# Patient Record
Sex: Female | Born: 1965 | Race: White | Hispanic: No | Marital: Married | Smoking: Never smoker
Health system: Southern US, Community
[De-identification: ages and names within clinical notes are randomized; demographics above are authoritative.]

## PROBLEM LIST (undated history)

## (undated) DIAGNOSIS — E7800 Pure hypercholesterolemia, unspecified: Secondary | ICD-10-CM

## (undated) DIAGNOSIS — I10 Essential (primary) hypertension: Secondary | ICD-10-CM

## (undated) DIAGNOSIS — D509 Iron deficiency anemia, unspecified: Secondary | ICD-10-CM

## (undated) HISTORY — PX: TUBAL LIGATION: SHX77

---

## 2016-11-29 ENCOUNTER — Ambulatory Visit
Admission: EM | Admit: 2016-11-29 | Discharge: 2016-11-29 | Disposition: A | Discharge status: Home / Self Care | Payer: BLUE CROSS/BLUE SHIELD | Attending: Emergency Medicine | Admitting: Emergency Medicine

## 2016-11-29 ENCOUNTER — Encounter: Payer: Self-pay | Admitting: *Deleted

## 2016-11-29 DIAGNOSIS — M542 Cervicalgia: Secondary | ICD-10-CM

## 2016-11-29 DIAGNOSIS — R030 Elevated blood-pressure reading, without diagnosis of hypertension: Secondary | ICD-10-CM | POA: Diagnosis not present

## 2016-11-29 DIAGNOSIS — R51 Headache: Secondary | ICD-10-CM

## 2016-11-29 DIAGNOSIS — R519 Headache, unspecified: Secondary | ICD-10-CM

## 2016-11-29 MED ORDER — TRAMADOL HCL 50 MG PO TABS: ORAL_TABLET | ORAL | 0 refills | Status: AC

## 2016-11-29 MED ORDER — DICLOFENAC SODIUM 75 MG PO TBEC: 75.0000 mg | DELAYED_RELEASE_TABLET | Freq: Two times a day (BID) | ORAL | 0 refills | Status: AC

## 2016-11-29 MED ORDER — TIZANIDINE HCL 4 MG PO TABS: 4.0000 mg | ORAL_TABLET | Freq: Three times a day (TID) | ORAL | 0 refills | Status: AC | PRN

## 2016-11-29 NOTE — Discharge Instructions (Signed)
People tend to feel worse over the next several days, but most people are back to normal in 1 week. A small number of people will have persistent pain for up to six weeks. Take the diclofenac on a regular basis as directed. Drink plenty of water and take the medication with food to reduce the change of stomach irritation and to protect your kidneys. You may take up to 1 gram of tylenol 4 times a day. This with the NSAID is an extremely effective combination for pain.    Some people may require physical therapy. Early range of motion neck exercises has been shown to speed recovery. Start doing them as soon as possible. Start doing small range and amplitude movements of your neck, first in one direction, then the other. Repeat this 10 times in each direction every hour while awake. Do these to the maximum comfortable range. You may do this sitting up or lying down.  Keep a log of your blood pressure. Measure at once a day, preferably at the same time every day. Bring this to your doctor's appointment. Decrease your salt intake. diet and exercise will lower your blood pressure significantly. It is important to keep your blood pressure under good control, as having a elevated for prolonged periods of time significantly increases your risk of stroke, heart attacks, kidney damage, eye damage, and other problems. Return here in a week for blood pressure recheck if you're able to find a primary care physician by then. Return immediately to the ER if you start having chest pain, headache, problems seeing, problems talking, problems walking, if you feel like you're about to pass out, if you do pass out, if you have a seizure, or for any other concerns.Marland Kitchen
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
## 2017-06-04 ENCOUNTER — Other Ambulatory Visit: Payer: Self-pay | Admitting: Medical Oncology

## 2017-06-04 DIAGNOSIS — Z1231 Encounter for screening mammogram for malignant neoplasm of breast: Secondary | ICD-10-CM

## 2017-07-12 ENCOUNTER — Ambulatory Visit
Admission: RE | Admit: 2017-07-12 | Discharge: 2017-07-12 | Disposition: A | Discharge status: Home / Self Care | Payer: BLUE CROSS/BLUE SHIELD | Source: Ambulatory Visit | Attending: Medical Oncology | Admitting: Medical Oncology

## 2017-07-12 DIAGNOSIS — R928 Other abnormal and inconclusive findings on diagnostic imaging of breast: Secondary | ICD-10-CM | POA: Insufficient documentation

## 2017-07-12 DIAGNOSIS — Z1231 Encounter for screening mammogram for malignant neoplasm of breast: Secondary | ICD-10-CM | POA: Insufficient documentation

## 2017-07-17 ENCOUNTER — Other Ambulatory Visit: Payer: Self-pay | Admitting: *Deleted

## 2017-07-17 ENCOUNTER — Inpatient Hospital Stay
Admission: RE | Admit: 2017-07-17 | Discharge: 2017-07-17 | Disposition: A | Discharge status: Home / Self Care | Payer: Self-pay | Source: Ambulatory Visit | Attending: *Deleted | Admitting: *Deleted

## 2017-07-17 DIAGNOSIS — Z9289 Personal history of other medical treatment: Secondary | ICD-10-CM

## 2017-07-22 ENCOUNTER — Other Ambulatory Visit: Payer: Self-pay | Admitting: Medical Oncology

## 2017-07-22 DIAGNOSIS — R928 Other abnormal and inconclusive findings on diagnostic imaging of breast: Secondary | ICD-10-CM

## 2017-07-25 ENCOUNTER — Other Ambulatory Visit: Payer: Self-pay | Admitting: Medical Oncology

## 2017-07-25 DIAGNOSIS — R928 Other abnormal and inconclusive findings on diagnostic imaging of breast: Secondary | ICD-10-CM

## 2017-07-25 DIAGNOSIS — N6489 Other specified disorders of breast: Secondary | ICD-10-CM

## 2017-08-02 ENCOUNTER — Ambulatory Visit
Admission: RE | Admit: 2017-08-02 | Discharge: 2017-08-02 | Disposition: A | Discharge status: Home / Self Care | Payer: BLUE CROSS/BLUE SHIELD | Source: Ambulatory Visit | Attending: Medical Oncology | Admitting: Medical Oncology

## 2017-08-02 DIAGNOSIS — R928 Other abnormal and inconclusive findings on diagnostic imaging of breast: Secondary | ICD-10-CM | POA: Diagnosis present

## 2017-08-02 DIAGNOSIS — N6489 Other specified disorders of breast: Secondary | ICD-10-CM | POA: Diagnosis not present

## 2017-12-24 ENCOUNTER — Other Ambulatory Visit: Payer: Self-pay | Admitting: Medical Oncology

## 2017-12-24 DIAGNOSIS — R928 Other abnormal and inconclusive findings on diagnostic imaging of breast: Secondary | ICD-10-CM

## 2018-02-05 ENCOUNTER — Other Ambulatory Visit: Payer: BLUE CROSS/BLUE SHIELD

## 2018-02-07 ENCOUNTER — Ambulatory Visit
Admission: RE | Admit: 2018-02-07 | Discharge: 2018-02-07 | Disposition: A | Discharge status: Home / Self Care | Payer: BLUE CROSS/BLUE SHIELD | Source: Ambulatory Visit | Attending: Medical Oncology | Admitting: Medical Oncology

## 2018-02-07 DIAGNOSIS — R928 Other abnormal and inconclusive findings on diagnostic imaging of breast: Secondary | ICD-10-CM | POA: Insufficient documentation

## 2018-02-14 ENCOUNTER — Other Ambulatory Visit: Payer: BLUE CROSS/BLUE SHIELD

## 2018-06-26 ENCOUNTER — Other Ambulatory Visit: Payer: Self-pay | Admitting: Medical Oncology

## 2018-06-26 DIAGNOSIS — R928 Other abnormal and inconclusive findings on diagnostic imaging of breast: Secondary | ICD-10-CM

## 2018-08-22 ENCOUNTER — Other Ambulatory Visit: Payer: BLUE CROSS/BLUE SHIELD

## 2018-08-29 ENCOUNTER — Other Ambulatory Visit: Payer: BLUE CROSS/BLUE SHIELD

## 2018-09-05 ENCOUNTER — Other Ambulatory Visit: Payer: Self-pay | Admitting: Medical Oncology

## 2018-09-05 ENCOUNTER — Ambulatory Visit
Admission: RE | Admit: 2018-09-05 | Discharge: 2018-09-05 | Disposition: A | Discharge status: Home / Self Care | Payer: BLUE CROSS/BLUE SHIELD | Source: Ambulatory Visit | Attending: Medical Oncology | Admitting: Medical Oncology

## 2018-09-05 DIAGNOSIS — R928 Other abnormal and inconclusive findings on diagnostic imaging of breast: Secondary | ICD-10-CM | POA: Insufficient documentation

## 2018-09-05 DIAGNOSIS — N6320 Unspecified lump in the left breast, unspecified quadrant: Secondary | ICD-10-CM | POA: Insufficient documentation

## 2020-04-20 ENCOUNTER — Other Ambulatory Visit: Payer: Self-pay | Admitting: Medical Oncology

## 2020-04-20 DIAGNOSIS — R928 Other abnormal and inconclusive findings on diagnostic imaging of breast: Secondary | ICD-10-CM

## 2020-04-29 ENCOUNTER — Ambulatory Visit
Admission: RE | Admit: 2020-04-29 | Discharge: 2020-04-29 | Disposition: A | Discharge status: Home / Self Care | Payer: BC Managed Care – PPO | Source: Ambulatory Visit | Attending: Medical Oncology | Admitting: Medical Oncology

## 2020-04-29 DIAGNOSIS — R928 Other abnormal and inconclusive findings on diagnostic imaging of breast: Secondary | ICD-10-CM | POA: Insufficient documentation

## 2021-05-11 ENCOUNTER — Other Ambulatory Visit: Payer: Self-pay

## 2021-05-11 DIAGNOSIS — Z1231 Encounter for screening mammogram for malignant neoplasm of breast: Secondary | ICD-10-CM

## 2021-05-17 ENCOUNTER — Inpatient Hospital Stay: Admission: RE | Admit: 2021-05-17 | Payer: BC Managed Care – PPO | Source: Ambulatory Visit

## 2021-06-05 ENCOUNTER — Other Ambulatory Visit: Payer: Self-pay

## 2021-06-05 ENCOUNTER — Ambulatory Visit
Admission: RE | Admit: 2021-06-05 | Discharge: 2021-06-05 | Disposition: A | Discharge status: Home / Self Care | Payer: BC Managed Care – PPO | Source: Ambulatory Visit

## 2021-06-05 DIAGNOSIS — Z1231 Encounter for screening mammogram for malignant neoplasm of breast: Secondary | ICD-10-CM

## 2021-06-07 ENCOUNTER — Other Ambulatory Visit: Payer: Self-pay

## 2021-06-07 DIAGNOSIS — N6320 Unspecified lump in the left breast, unspecified quadrant: Secondary | ICD-10-CM

## 2021-06-07 DIAGNOSIS — R928 Other abnormal and inconclusive findings on diagnostic imaging of breast: Secondary | ICD-10-CM

## 2021-06-13 ENCOUNTER — Other Ambulatory Visit: Payer: Self-pay

## 2021-06-13 ENCOUNTER — Ambulatory Visit
Admission: RE | Admit: 2021-06-13 | Discharge: 2021-06-13 | Disposition: A | Discharge status: Home / Self Care | Payer: BC Managed Care – PPO | Source: Ambulatory Visit

## 2021-06-13 DIAGNOSIS — N6320 Unspecified lump in the left breast, unspecified quadrant: Secondary | ICD-10-CM | POA: Insufficient documentation

## 2021-06-13 DIAGNOSIS — R928 Other abnormal and inconclusive findings on diagnostic imaging of breast: Secondary | ICD-10-CM

## 2021-11-03 ENCOUNTER — Encounter: Payer: Self-pay | Admitting: *Deleted

## 2021-11-03 ENCOUNTER — Ambulatory Visit
Admission: RE | Admit: 2021-11-03 | Discharge: 2021-11-03 | Disposition: A | Discharge status: Home / Self Care | Payer: BC Managed Care – PPO | Attending: Gastroenterology | Admitting: Gastroenterology

## 2021-11-03 ENCOUNTER — Other Ambulatory Visit: Payer: Self-pay | Admitting: Gastroenterology

## 2021-11-03 ENCOUNTER — Ambulatory Visit: Payer: BC Managed Care – PPO | Admitting: Anesthesiology

## 2021-11-03 ENCOUNTER — Other Ambulatory Visit: Payer: Self-pay

## 2021-11-03 ENCOUNTER — Encounter
Admission: RE | Disposition: A | Discharge status: Home / Self Care | Payer: Self-pay | Source: Home / Self Care | Attending: Gastroenterology

## 2021-11-03 DIAGNOSIS — Z6841 Body Mass Index (BMI) 40.0 and over, adult: Secondary | ICD-10-CM | POA: Insufficient documentation

## 2021-11-03 DIAGNOSIS — K6389 Other specified diseases of intestine: Secondary | ICD-10-CM | POA: Diagnosis not present

## 2021-11-03 DIAGNOSIS — K389 Disease of appendix, unspecified: Secondary | ICD-10-CM

## 2021-11-03 DIAGNOSIS — E669 Obesity, unspecified: Secondary | ICD-10-CM | POA: Insufficient documentation

## 2021-11-03 DIAGNOSIS — Z1211 Encounter for screening for malignant neoplasm of colon: Secondary | ICD-10-CM | POA: Insufficient documentation

## 2021-11-03 DIAGNOSIS — D125 Benign neoplasm of sigmoid colon: Secondary | ICD-10-CM | POA: Insufficient documentation

## 2021-11-03 HISTORY — DX: Iron deficiency anemia, unspecified: D50.9

## 2021-11-03 HISTORY — DX: Essential (primary) hypertension: I10

## 2021-11-03 HISTORY — DX: Pure hypercholesterolemia, unspecified: E78.00

## 2021-11-03 HISTORY — PX: COLONOSCOPY WITH PROPOFOL: SHX5780

## 2021-11-03 SURGERY — COLONOSCOPY WITH PROPOFOL
Anesthesia: General

## 2021-11-03 MED ORDER — LIDOCAINE HCL (PF) 1 % IJ SOLN
INTRAMUSCULAR | Status: AC
  Administered 2021-11-03: 0.2 mL
  Filled 2021-11-03: qty 2

## 2021-11-03 MED ORDER — PROPOFOL 10 MG/ML IV BOLUS
INTRAVENOUS | Status: DC | PRN
  Administered 2021-11-03: 80 mg via INTRAVENOUS
  Administered 2021-11-03: 20 mg via INTRAVENOUS

## 2021-11-03 MED ORDER — EPHEDRINE SULFATE 50 MG/ML IJ SOLN
INTRAMUSCULAR | Status: DC | PRN
  Administered 2021-11-03 (×2): 5 mg via INTRAVENOUS

## 2021-11-03 MED ORDER — SODIUM CHLORIDE 0.9 % IV SOLN: INTRAVENOUS | Status: DC | PRN

## 2021-11-03 MED ORDER — LIDOCAINE HCL (CARDIAC) PF 100 MG/5ML IV SOSY
PREFILLED_SYRINGE | INTRAVENOUS | Status: DC | PRN
  Administered 2021-11-03: 50 mg via INTRAVENOUS

## 2021-11-03 MED ORDER — PROPOFOL 500 MG/50ML IV EMUL
INTRAVENOUS | Status: DC | PRN
  Administered 2021-11-03: 180 ug/kg/min via INTRAVENOUS

## 2021-11-03 MED ORDER — SODIUM CHLORIDE 0.9 % IV SOLN: INTRAVENOUS | Status: DC

## 2021-11-03 MED ORDER — PROPOFOL 500 MG/50ML IV EMUL
INTRAVENOUS | Status: AC
  Filled 2021-11-03: qty 50

## 2021-11-03 NOTE — Anesthesia Postprocedure Evaluation (Signed)
Anesthesia Post Note  Patient: Alicia Mcguire  Procedure(s) Performed: COLONOSCOPY WITH PROPOFOL  Patient location during evaluation: Endoscopy Anesthesia Type: General Level of consciousness: awake and alert Pain management: pain level controlled Vital Signs Assessment: post-procedure vital signs reviewed and stable Respiratory status: spontaneous breathing, nonlabored ventilation, respiratory function stable and patient connected to nasal cannula oxygen Cardiovascular status: blood pressure returned to baseline and stable Postop Assessment: no apparent nausea or vomiting Anesthetic complications: no   No notable events documented.   Last Vitals:  Vitals:   11/03/21 1058 11/03/21 1100  BP: (!) 77/46 124/73  Pulse: 73 82  Resp: 18 (!) 29  Temp:    SpO2: 97% 98%    Last Pain:  Vitals:   11/03/21 1048  TempSrc: Temporal  PainSc: Asleep                 [name]

## 2021-11-03 NOTE — Interval H&P Note (Signed)
History and Physical Interval Note:  11/03/2021 10:21 AM  Alicia Mcguire  has presented today for surgery, with the diagnosis of colon cancer screening.  The various methods of treatment have been discussed with the patient and family. After consideration of risks, benefits and other options for treatment, the patient has consented to  Procedure(s): COLONOSCOPY WITH PROPOFOL (N/A) as a surgical intervention.  The patient's history has been reviewed, patient examined, no change in status, stable for surgery.  I have reviewed the patient's chart and labs.  Questions were answered to the patient's satisfaction.     [name]  Ok to proceed with colonoscopy

## 2021-11-03 NOTE — H&P (Signed)
Outpatient short stay form Pre-procedure 11/03/2021  Alicia Rubenstein, MD  Primary Physician: Earlie Counts, FNP  Reason for visit:  Screening Colonoscopy  History of present illness:   55 y/o lady with history of hypertension here for index screening colonoscopy. No blood thinners. History of c -section. No first degree relatives with colon cancer. No new symptoms.    Current Facility-Administered Medications:    0.9 %  sodium chloride infusion, , Intravenous, Continuous, Tsuruko Murtha, Hilton Cork, MD, Last Rate: 20 mL/hr at 11/03/21 1010, New Bag at 11/03/21 1010  Medications Prior to Admission  Medication Sig Dispense Refill Last Dose   Cyanocobalamin (VITAMIN B 12) 500 MCG TABS Take 1,000 mg by mouth daily.   Past Month   diclofenac (VOLTAREN) 75 MG EC tablet Take 1 tablet (75 mg total) by mouth 2 (two) times daily. Take with food 30 tablet 0 Past Month   ferrous sulfate 325 (65 FE) MG tablet Take 325 mg by mouth daily with breakfast.   Past Month   lisinopril (ZESTRIL) 10 MG tablet Take 10 mg by mouth daily.   11/02/2021 at 0800   tiZANidine (ZANAFLEX) 4 MG tablet Take 1 tablet (4 mg total) by mouth every 8 (eight) hours as needed for muscle spasms. 30 tablet 0    traMADol (ULTRAM) 50 MG tablet 1-2 tabs po q 6 hr prn pain Maximum dose= 8 tablets per day 20 tablet 0      No Known Allergies   Past Medical History:  Diagnosis Date   Hypercholesterolemia    Hypertension    IDA (iron deficiency anemia)     Review of systems:  Otherwise negative.    Physical Exam  Gen: Alert, oriented. Appears stated age.  HEENT: PERRLA. Lungs: No respiratory distress CV: RRR Abd: soft, benign, no masses Ext: No edema    Planned procedures: Proceed with colonoscopy. The patient understands the nature of the planned procedure, indications, risks, alternatives and potential complications including but not limited to bleeding, infection, perforation, damage to internal organs and possible  oversedation/side effects from anesthesia. The patient agrees and gives consent to proceed.  Please refer to procedure notes for findings, recommendations and patient disposition/instructions.     Alicia Rubenstein, MD [facility]

## 2021-11-03 NOTE — Anesthesia Preprocedure Evaluation (Addendum)
Anesthesia Evaluation  Patient identified by MRN, date of birth, ID band Patient awake    Reviewed: Allergy & Precautions, H&P , NPO status , Patient's Chart, lab work & pertinent test results  Airway Mallampati: III  TM Distance: >3 FB Neck ROM: Full    Dental no notable dental hx. (+) Chipped   Pulmonary neg pulmonary ROS,    Pulmonary exam normal        Cardiovascular hypertension, Pt. on medications negative cardio ROS Normal cardiovascular exam     Neuro/Psych negative neurological ROS  negative psych ROS   GI/Hepatic negative GI ROS, Neg liver ROS,   Endo/Other  negative endocrine ROS  Renal/GU negative Renal ROS  negative genitourinary   Musculoskeletal negative musculoskeletal ROS (+)   Abdominal (+) + obese,   Peds negative pediatric ROS (+)  Hematology negative hematology ROS (+)   Anesthesia Other Findings Past Medical History: No date: Hypercholesterolemia No date: Hypertension No date: IDA (iron deficiency anemia)  Past Surgical History: No date: CESAREAN SECTION No date: TUBAL LIGATION; Bilateral  BMI    Body Mass Index: 42.51 kg/m      Reproductive/Obstetrics negative OB ROS                            Anesthesia Physical Anesthesia Plan  ASA: 2  Anesthesia Plan: General   Post-op Pain Management:    Induction: Intravenous  PONV Risk Score and Plan: Propofol infusion and TIVA  Airway Management Planned: Natural Airway and Nasal Cannula  Additional Equipment:   Intra-op Plan:   Post-operative Plan:   Informed Consent: I have reviewed the patients History and Physical, chart, labs and discussed the procedure including the risks, benefits and alternatives for the proposed anesthesia with the patient or authorized representative who has indicated his/her understanding and acceptance.     Dental Advisory Given  Plan Discussed with: Anesthesiologist,  CRNA and Surgeon  Anesthesia Plan Comments: (Patient consented for risks of anesthesia including but not limited to:  - adverse reactions to medications - risk of airway placement if required - damage to eyes, teeth, lips or other oral mucosa - nerve damage due to positioning  - sore throat or hoarseness - Damage to heart, brain, nerves, lungs, other parts of body or loss of life  Patient voiced understanding.)        Anesthesia Quick Evaluation

## 2021-11-03 NOTE — Transfer of Care (Signed)
Immediate Anesthesia Transfer of Care Note  Patient: Alicia Mcguire  Procedure(s) Performed: COLONOSCOPY WITH PROPOFOL  Patient Location: PACU and Endoscopy Unit  Anesthesia Type:General  Level of Consciousness: drowsy  Airway & Oxygen Therapy: Patient Spontanous Breathing  Post-op Assessment: Report given to RN  Post vital signs: Reviewed and stable  Last Vitals:  Vitals Value Taken Time  BP 91/44 11/03/21 1048  Temp    Pulse 82 11/03/21 1049  Resp 22 11/03/21 1049  SpO2 98 % 11/03/21 1049  Vitals shown include unvalidated device data.  Last Pain:  Vitals:   11/03/21 0932  TempSrc: Temporal  PainSc: 0-No pain         Complications: No notable events documented.

## 2021-11-03 NOTE — Op Note (Addendum)
[facility] Patient Name: Alicia Mcguire Procedure Date: 11/03/2021 10:17 AM MRN: [id number] Account #: [id number] Date of Birth: [birth year] Admit Type: Outpatient Age: 55 Room: [facility] ENDO ROOM 3 Gender: Female Note Status: Finalized Instrument Name: Park Meo [id number] Procedure:             Colonoscopy Indications:           Screening for colorectal malignant neoplasm Providers:             Andrey Farmer MD, MD Referring MD:          No Local Md, MD (Referring MD) Medicines:             Monitored Anesthesia Care Complications:         No immediate complications. Estimated blood loss:                         Minimal. Procedure:             Pre-Anesthesia Assessment:                        - Prior to the procedure, a History and Physical was                         performed, and patient medications and allergies were                         reviewed. The patient is competent. The risks and                         benefits of the procedure and the sedation options and                         risks were discussed with the patient. All questions                         were answered and informed consent was obtained.                         Patient identification and proposed procedure were                         verified by the physician, the nurse, the anesthetist                         and the technician in the endoscopy suite. Mental                         Status Examination: alert and oriented. Airway                         Examination: normal oropharyngeal airway and neck                         mobility. Respiratory Examination: clear to                         auscultation. CV Examination: normal. Prophylactic  Antibiotics: The patient does not require prophylactic                         antibiotics. Prior Anticoagulants: The patient has                         taken no previous anticoagulant or antiplatelet                          agents. ASA Grade Assessment: II - A patient with mild                         systemic disease. After reviewing the risks and                         benefits, the patient was deemed in satisfactory                         condition to undergo the procedure. The anesthesia                         plan was to use monitored anesthesia care (MAC).                         Immediately prior to administration of medications,                         the patient was re-assessed for adequacy to receive                         sedatives. The heart rate, respiratory rate, oxygen                         saturations, blood pressure, adequacy of pulmonary                         ventilation, and response to care were monitored                         throughout the procedure. The physical status of the                         patient was re-assessed after the procedure.                        After obtaining informed consent, the colonoscope was                         passed under direct vision. Throughout the procedure,                         the patient's blood pressure, pulse, and oxygen                         saturations were monitored continuously. The                         Colonoscope was introduced through the anus and  advanced to the the cecum, identified by appendiceal                         orifice and ileocecal valve. The colonoscopy was                         performed without difficulty. The patient tolerated                         the procedure well. The quality of the bowel                         preparation was good. Findings:      The perianal and digital rectal examinations were normal.      One submucosal nodule was found at the appendiceal orifice. A white       milky substance was being extruded out of the ao.      Three sessile polyps were found in the sigmoid colon. The polyps were 2       to 3 mm in size. These polyps were  removed with a cold snare. Resection       and retrieval were complete. Estimated blood loss was minimal.      Internal hemorrhoids were found during retroflexion. The hemorrhoids       were Grade I (internal hemorrhoids that do not prolapse).      The exam was otherwise without abnormality on direct and retroflexion       views. Impression:            - Submucosal nodule at the appendiceal orifice.                        - Three 2 to 3 mm polyps in the sigmoid colon, removed                         with a cold snare. Resected and retrieved.                        - Internal hemorrhoids.                        - The examination was otherwise normal on direct and                         retroflexion views. Recommendation:        - Discharge patient to home.                        - Resume previous diet.                        - Continue present medications.                        - Await pathology results.                        - Repeat colonoscopy for surveillance based on                         pathology results.                        -  Return to referring physician as previously                         scheduled.                        - Perform CT scan (computed tomography) of the abdomen                         with contrast at the next available appointment to                         evalute the appendix. Procedure Code(s):     --- Professional ---                        [phone], Colonoscopy, flexible; with removal of                         tumor(s), polyp(s), or other lesion(s) by snare                         technique Diagnosis Code(s):     --- Professional ---                        Z12.11, Encounter for screening for malignant neoplasm                         of colon                        K63.89, Other specified diseases of intestine                        K63.5, Polyp of colon                        K64.0, First degree hemorrhoids CPT copyright 2019 American Medical  Association. All rights reserved. The codes documented in this report are preliminary and upon coder review may  be revised to meet current compliance requirements. Andrey Farmer MD, MD 11/03/2021 10:48:20 AM Number of Addenda: 0 Note Initiated On: 11/03/2021 10:17 AM Scope Withdrawal Time: 0 hours 12 minutes 58 seconds  Total Procedure Duration: 0 hours 16 minutes 47 seconds  Estimated Blood Loss:  Estimated blood loss was minimal.      [facility]

## 2021-11-06 ENCOUNTER — Encounter: Payer: Self-pay | Admitting: Gastroenterology

## 2021-11-06 LAB — SURGICAL PATHOLOGY

## 2021-12-06 ENCOUNTER — Other Ambulatory Visit: Payer: Self-pay

## 2021-12-08 ENCOUNTER — Ambulatory Visit
Admission: RE | Admit: 2021-12-08 | Discharge: 2021-12-08 | Disposition: A | Discharge status: Home / Self Care | Payer: BC Managed Care – PPO | Source: Ambulatory Visit | Attending: Gastroenterology | Admitting: Gastroenterology

## 2021-12-08 ENCOUNTER — Other Ambulatory Visit: Payer: Self-pay

## 2021-12-08 DIAGNOSIS — K389 Disease of appendix, unspecified: Secondary | ICD-10-CM | POA: Diagnosis not present

## 2021-12-08 LAB — POCT I-STAT CREATININE: Creatinine, Ser: 0.9 mg/dL (ref 0.44–1.00)

## 2021-12-08 MED ORDER — IOHEXOL 300 MG/ML  SOLN
100.0000 mL | Freq: Once | INTRAMUSCULAR | Status: AC | PRN
  Administered 2021-12-08: 10:00:00 100 mL via INTRAVENOUS

## 2021-12-14 ENCOUNTER — Other Ambulatory Visit: Payer: Self-pay | Admitting: Family Medicine

## 2021-12-14 DIAGNOSIS — Z1231 Encounter for screening mammogram for malignant neoplasm of breast: Secondary | ICD-10-CM

## 2022-06-08 ENCOUNTER — Ambulatory Visit: Payer: BC Managed Care – PPO | Attending: Family Medicine

## 2022-08-30 ENCOUNTER — Ambulatory Visit
Admission: RE | Admit: 2022-08-30 | Discharge: 2022-08-30 | Disposition: A | Discharge status: Home / Self Care | Payer: BC Managed Care – PPO | Source: Ambulatory Visit | Attending: Family Medicine | Admitting: Family Medicine

## 2022-08-30 DIAGNOSIS — Z1231 Encounter for screening mammogram for malignant neoplasm of breast: Secondary | ICD-10-CM | POA: Diagnosis present

## 2023-04-05 ENCOUNTER — Other Ambulatory Visit: Payer: Self-pay

## 2023-04-05 DIAGNOSIS — Z1231 Encounter for screening mammogram for malignant neoplasm of breast: Secondary | ICD-10-CM

## 2023-04-11 IMAGING — MG MM DIGITAL DIAGNOSTIC UNILAT*L* W/ TOMO W/ CAD
4 series · 4 of 12 positions shown · non-contrast
Comparison: Previous exam(s).

CLINICAL DATA: Screening recall for a possible left breast mass.

EXAM:
DIGITAL DIAGNOSTIC UNILATERAL LEFT MAMMOGRAM WITH TOMOSYNTHESIS AND
CAD; ULTRASOUND LEFT BREAST LIMITED
TECHNIQUE: Left digital diagnostic mammography and breast tomosynthesis was
performed. The images were evaluated with computer-aided detection.;
Targeted ultrasound examination of the left breast was performed

[L MLO synth-2D]
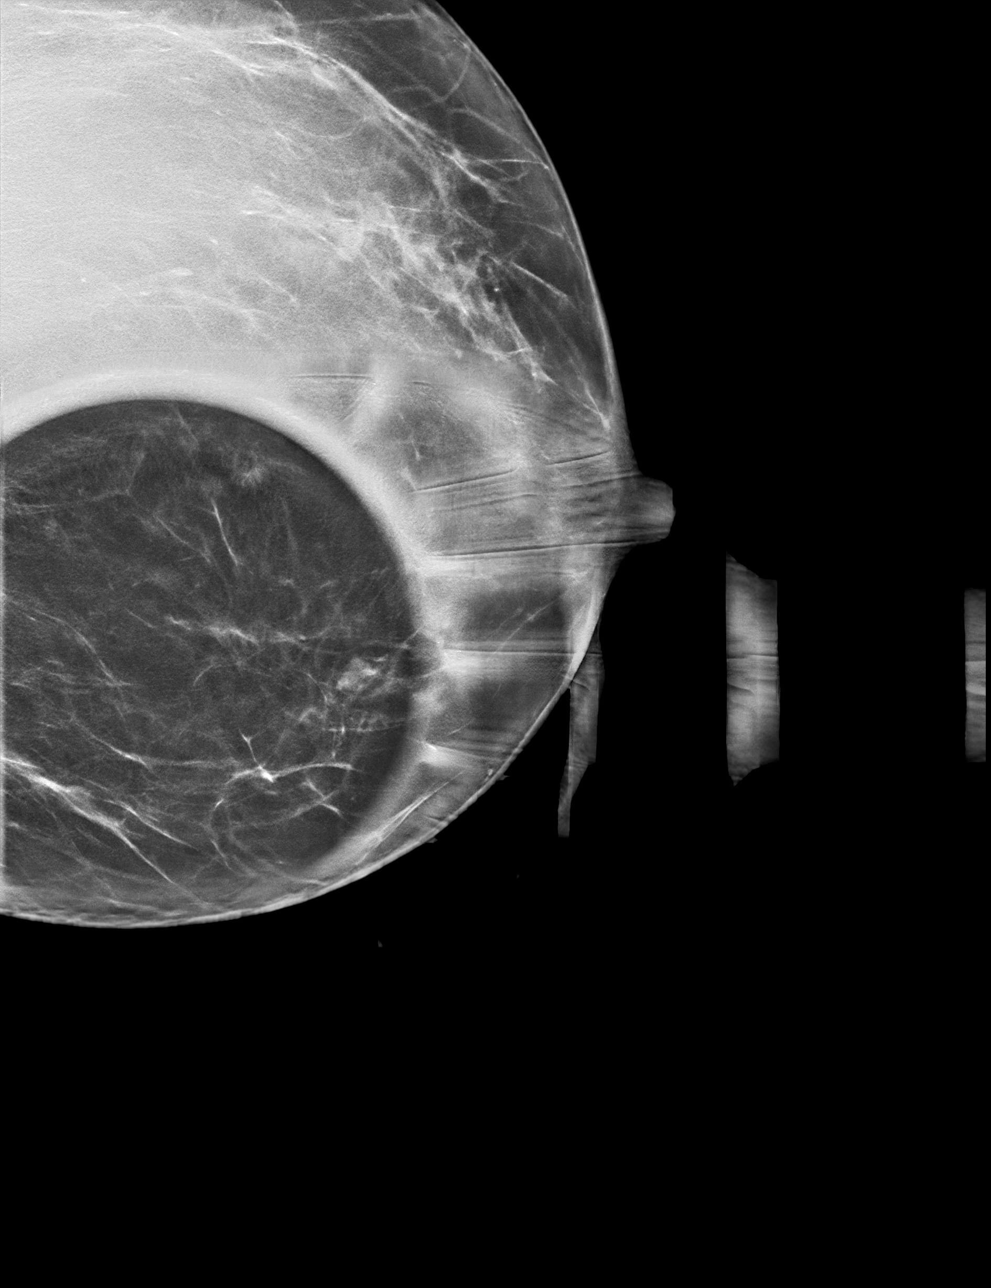

[L CC synth-2D]
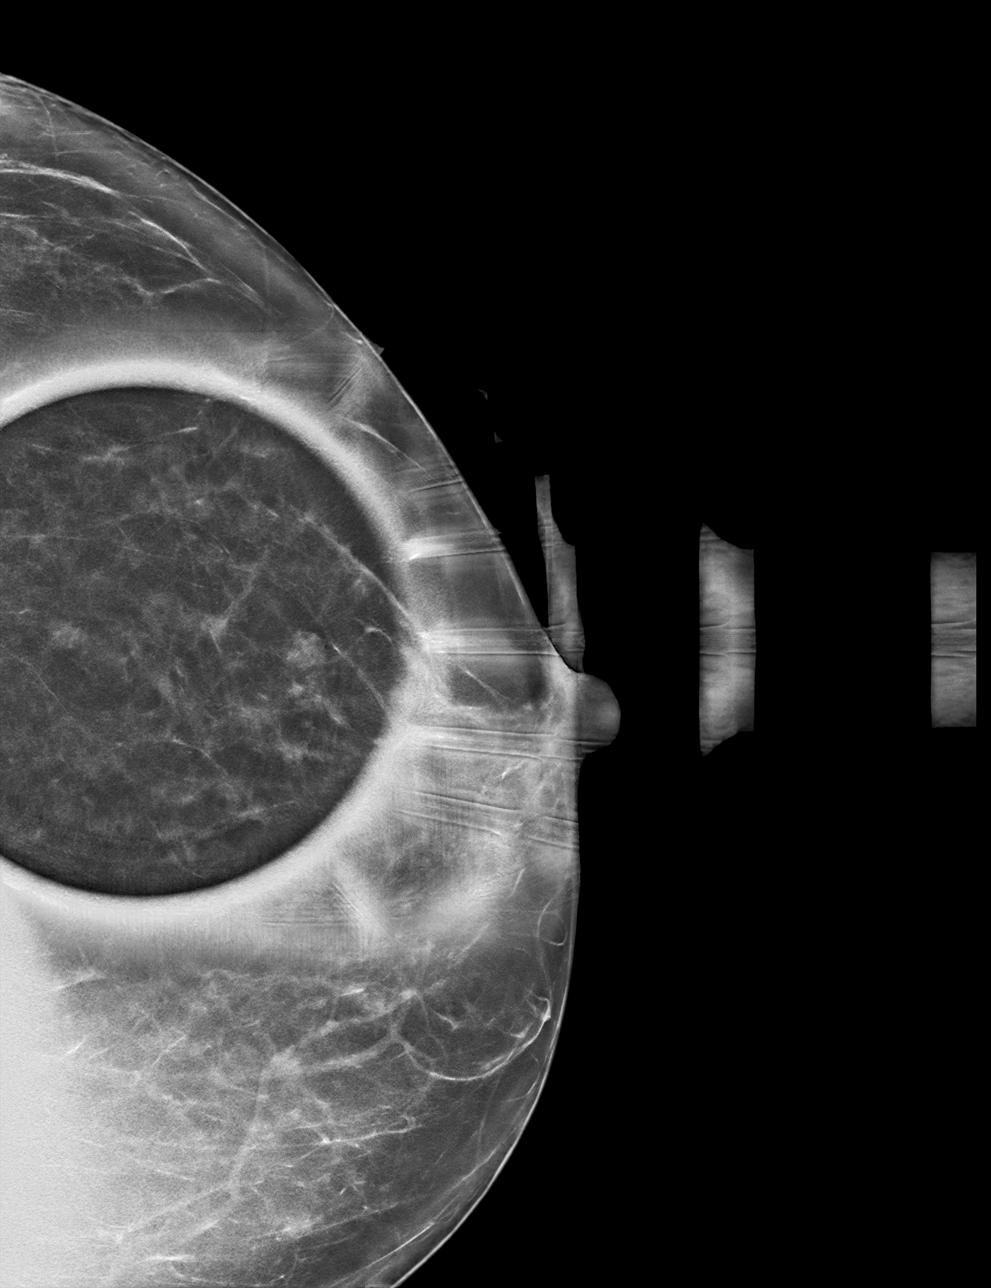

[L MLO tomo · tomo slice 40/79.0]
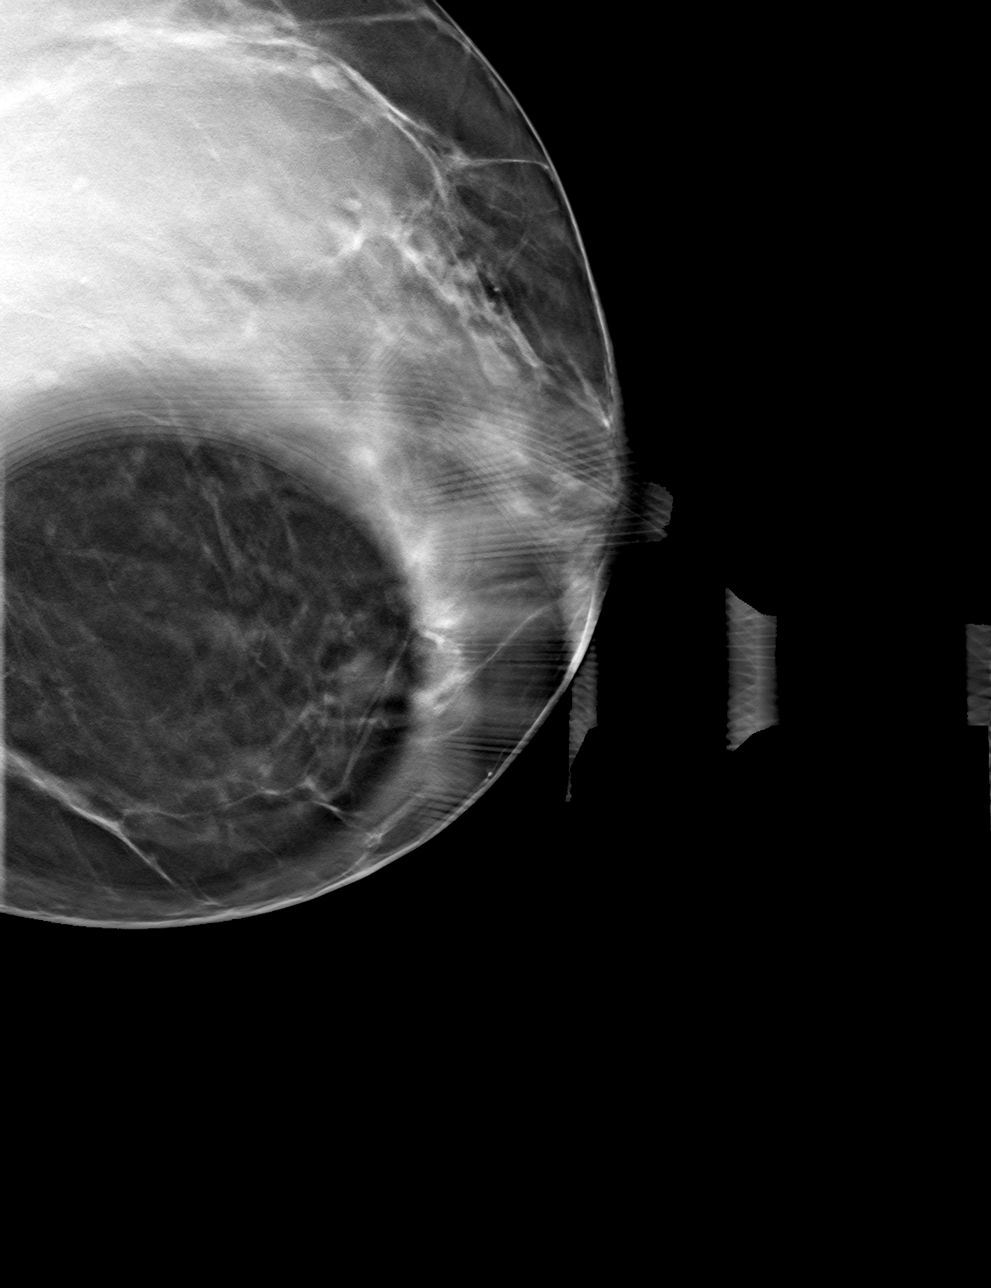

[L CC tomo · tomo slice 33/64.0]
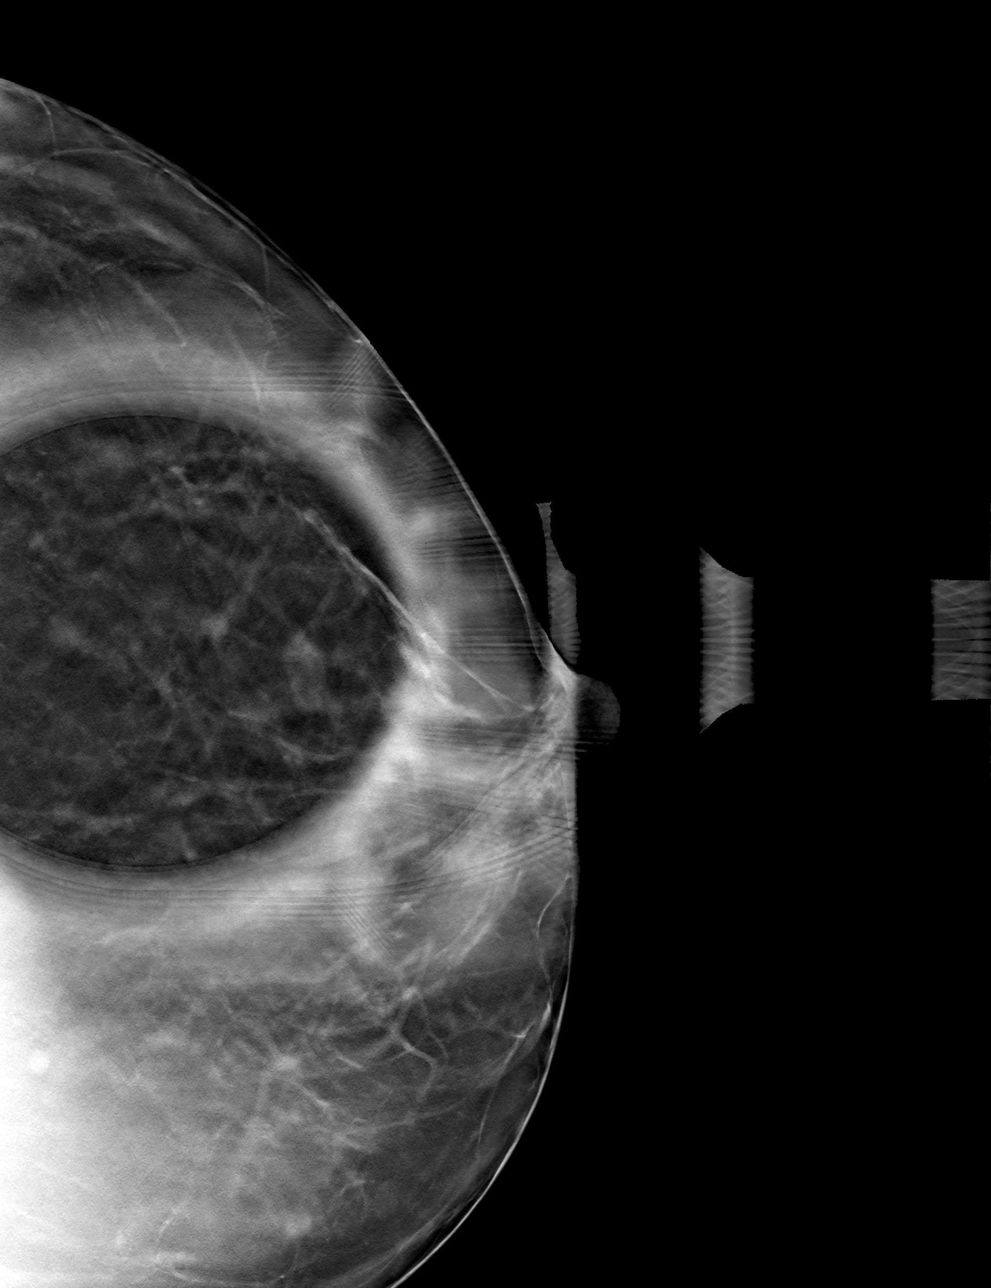

[4 of 12 positions shown; findings below may reference images not displayed]

ACR Breast Density Category b: There are scattered areas of
fibroglandular density.
FINDINGS: The possible mass, noted in the inferior left breast, anterior
depth, on the current screening study, persists as a small lobulated
mass, approximately 6 mm in size. There are no other suspicious
masses, no areas of architectural distortion and no suspicious
calcifications.

On physical exam, no mass is palpated in the inferior left breast.

Targeted ultrasound is performed, showing a cluster of cysts in the
left breast at 6 o'clock, 4 cm the nipple, anterior depth, measuring
8 x 5 x 7 mm, consistent in size, shape and location to the
mammographic mass. There are no solid masses or suspicious lesions.
IMPRESSION: 1. No evidence of breast malignancy.
2. Benign cluster of cysts/apocrine cyst in the left breast at 6
o'clock.

RECOMMENDATION:
Screening mammogram in one year.(Code:Y1-A-UU4)

I have discussed the findings and recommendations with the patient.
If applicable, a reminder letter will be sent to the patient
regarding the next appointment.

BI-RADS CATEGORY  2: Benign.

## 2023-07-01 ENCOUNTER — Other Ambulatory Visit: Payer: Self-pay | Admitting: Obstetrics and Gynecology

## 2023-07-01 DIAGNOSIS — Z1231 Encounter for screening mammogram for malignant neoplasm of breast: Secondary | ICD-10-CM

## 2023-09-06 ENCOUNTER — Ambulatory Visit
Admission: RE | Admit: 2023-09-06 | Discharge: 2023-09-06 | Disposition: A | Discharge status: Home / Self Care | Payer: BC Managed Care – PPO | Source: Ambulatory Visit | Attending: Obstetrics and Gynecology | Admitting: Obstetrics and Gynecology

## 2023-09-06 DIAGNOSIS — Z1231 Encounter for screening mammogram for malignant neoplasm of breast: Secondary | ICD-10-CM | POA: Insufficient documentation

## 2024-04-22 ENCOUNTER — Other Ambulatory Visit: Payer: Self-pay | Admitting: Family Medicine

## 2024-04-22 DIAGNOSIS — Z1231 Encounter for screening mammogram for malignant neoplasm of breast: Secondary | ICD-10-CM

## 2024-09-11 ENCOUNTER — Ambulatory Visit
Admission: RE | Admit: 2024-09-11 | Discharge: 2024-09-11 | Disposition: A | Discharge status: Home / Self Care | Source: Ambulatory Visit | Attending: Family Medicine | Admitting: Family Medicine

## 2024-09-11 DIAGNOSIS — Z1231 Encounter for screening mammogram for malignant neoplasm of breast: Secondary | ICD-10-CM | POA: Diagnosis present
